# Patient Record
Sex: Female | Born: 1990 | Race: Black or African American | Hispanic: No | Marital: Single | Smoking: Never smoker
Health system: Southern US, Community
[De-identification: ages and names within clinical notes are randomized; demographics above are authoritative.]

---

## 2016-01-25 ENCOUNTER — Emergency Department (HOSPITAL_COMMUNITY)
Admission: EM | Admit: 2016-01-25 | Discharge: 2016-01-25 | Disposition: A | Discharge status: Home / Self Care | Payer: BLUE CROSS/BLUE SHIELD | Attending: Emergency Medicine | Admitting: Emergency Medicine

## 2016-01-25 ENCOUNTER — Emergency Department (HOSPITAL_COMMUNITY): Payer: BLUE CROSS/BLUE SHIELD

## 2016-01-25 ENCOUNTER — Encounter (HOSPITAL_COMMUNITY): Payer: Self-pay

## 2016-01-25 DIAGNOSIS — Y929 Unspecified place or not applicable: Secondary | ICD-10-CM | POA: Diagnosis not present

## 2016-01-25 DIAGNOSIS — S0993XA Unspecified injury of face, initial encounter: Secondary | ICD-10-CM | POA: Diagnosis present

## 2016-01-25 DIAGNOSIS — S022XXA Fracture of nasal bones, initial encounter for closed fracture: Secondary | ICD-10-CM | POA: Diagnosis not present

## 2016-01-25 DIAGNOSIS — S63501A Unspecified sprain of right wrist, initial encounter: Secondary | ICD-10-CM | POA: Diagnosis not present

## 2016-01-25 DIAGNOSIS — Y999 Unspecified external cause status: Secondary | ICD-10-CM | POA: Diagnosis not present

## 2016-01-25 DIAGNOSIS — Y939 Activity, unspecified: Secondary | ICD-10-CM | POA: Diagnosis not present

## 2016-01-25 MED ORDER — TRAMADOL HCL 50 MG PO TABS
50.0000 mg | ORAL_TABLET | Freq: Once | ORAL | Status: AC
  Administered 2016-01-25: 50 mg via ORAL
  Filled 2016-01-25: qty 1

## 2016-01-25 MED ORDER — TRAMADOL HCL 50 MG PO TABS: 50.0000 mg | ORAL_TABLET | Freq: Four times a day (QID) | ORAL | 0 refills | Status: DC | PRN

## 2016-01-25 NOTE — Discharge Instructions (Signed)
Follow up with ENT. Take pain medication as needed. Return to the ED if you experience loss of consciousness, uncontrollable nose bleeding, blurred vision, vomiting.

## 2016-01-25 NOTE — ED Notes (Signed)
Patient transported to CT 
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
## 2019-10-11 ENCOUNTER — Ambulatory Visit
Admission: EM | Admit: 2019-10-11 | Discharge: 2019-10-11 | Disposition: A | Discharge status: Home / Self Care | Payer: Commercial Managed Care - PPO | Attending: Family Medicine | Admitting: Family Medicine

## 2019-10-11 ENCOUNTER — Other Ambulatory Visit: Payer: Self-pay

## 2019-10-11 ENCOUNTER — Ambulatory Visit: Payer: Self-pay

## 2019-10-11 ENCOUNTER — Encounter: Payer: Self-pay | Admitting: Emergency Medicine

## 2019-10-11 DIAGNOSIS — R5382 Chronic fatigue, unspecified: Secondary | ICD-10-CM | POA: Diagnosis present

## 2019-10-11 DIAGNOSIS — J029 Acute pharyngitis, unspecified: Secondary | ICD-10-CM | POA: Diagnosis not present

## 2019-10-11 DIAGNOSIS — R635 Abnormal weight gain: Secondary | ICD-10-CM

## 2019-10-11 LAB — BASIC METABOLIC PANEL
Anion gap: 6 (ref 5–15)
BUN: 9 mg/dL (ref 6–20)
CO2: 26 mmol/L (ref 22–32)
Calcium: 8.9 mg/dL (ref 8.9–10.3)
Chloride: 105 mmol/L (ref 98–111)
Creatinine, Ser: 0.81 mg/dL (ref 0.44–1.00)
GFR calc Af Amer: >60 mL/min (ref >60)
GFR calc non Af Amer: >60 mL/min (ref >60)
Glucose, Bld: 67 mg/dL — ABNORMAL LOW (ref 70–99)
Potassium: 3.3 mmol/L — ABNORMAL LOW (ref 3.5–5.1)
Sodium: 137 mmol/L (ref 135–145)

## 2019-10-11 LAB — CBC WITH DIFFERENTIAL/PLATELET
Abs Immature Granulocytes: 0.01 10*3/uL (ref 0.00–0.07)
Basophils Absolute: 0 10*3/uL (ref 0.0–0.1)
Basophils Relative: 1 %
Eosinophils Absolute: 0.1 10*3/uL (ref 0.0–0.5)
Eosinophils Relative: 1 %
HCT: 40.7 % (ref 36.0–46.0)
Hemoglobin: 13.9 g/dL (ref 12.0–15.0)
Immature Granulocytes: 0 %
Lymphocytes Relative: 42 %
Lymphs Abs: 2.7 10*3/uL (ref 0.7–4.0)
MCH: 27.6 pg (ref 26.0–34.0)
MCHC: 34.2 g/dL (ref 30.0–36.0)
MCV: 80.9 fL (ref 80.0–100.0)
Monocytes Absolute: 0.6 10*3/uL (ref 0.1–1.0)
Monocytes Relative: 10 %
Neutro Abs: 3 10*3/uL (ref 1.7–7.7)
Neutrophils Relative %: 46 %
Platelets: 328 10*3/uL (ref 150–400)
RBC: 5.03 MIL/uL (ref 3.87–5.11)
RDW: 12.9 % (ref 11.5–15.5)
WBC: 6.5 10*3/uL (ref 4.0–10.5)
nRBC: 0 % (ref 0.0–0.2)

## 2019-10-11 LAB — GROUP A STREP BY PCR: Group A Strep by PCR: NOT DETECTED

## 2019-10-11 LAB — TSH: TSH: 2.845 u[IU]/mL (ref 0.350–4.500)

## 2019-10-11 LAB — PREGNANCY, URINE: Preg Test, Ur: NEGATIVE

## 2019-10-11 NOTE — Discharge Instructions (Signed)
Awaiting lab results.  Please see your OB GYN.  Results will go to your MyChart.  Take care  Dr. Adriana Simas

## 2019-10-11 NOTE — ED Triage Notes (Signed)
Patient in today c/o pain in her throat x 4 days. Patient also c/o extreme fatigue x 3 months and a 40lb weight gain in 6 months. Patient denies fever. Patient has taken OTC Ibuprofen.

## 2019-10-11 NOTE — ED Triage Notes (Signed)
Patient doesn't have a PCP, but saw her GYN in 06/2019. Patient did not mention the fatigue and weight gain to her GYN.

## 2019-10-12 NOTE — ED Provider Notes (Signed)
[facility]    CSN: [id number] Arrival date & time: 10/11/19  1208  History   Chief Complaint Chief Complaint  Patient presents with  . throat pain  . Fatigue  . Weight Gain   HPI  29 year old female presents with the above complaints.  Patient reports sore throat for the past 4 days.  No respiratory symptoms.  Patient also reports she has had ongoing weight gain since December.  States that she has gained 40 pounds.  Patient also reports ongoing severe fatigue.  This has been going on for least 3 months.  Patient does not have a primary care provider.  She does have an OB/GYN and has not seen anyone for her complaints.  Patient states that she exercises approximately 1 time a week.  She endorses a healthy diet.  No other reported symptoms.  No other complaints.  Past Surgical History:  Procedure Laterality Date  . CESAREAN SECTION      OB History   No obstetric history on file.      Home Medications    Prior to Admission medications   Not on File    Family History Family History  Problem Relation Age of Onset  . Hypertension Mother   . Hypertension Father     Social History Social History   Tobacco Use  . Smoking status: Never Smoker  . Smokeless tobacco: Never Used  Vaping Use  . Vaping Use: Never used  Substance Use Topics  . Alcohol use: Yes    Comment: rare  . Drug use: Never     Allergies   Patient has no known allergies.   Review of Systems Review of Systems  Constitutional: Positive for fatigue and unexpected weight change.  HENT: Positive for sore throat.    Physical Exam Triage Vital Signs ED Triage Vitals  Enc Vitals Group     BP 10/11/19 1252 113/77     Pulse Rate 10/11/19 1252 89     Resp 10/11/19 1252 18     Temp 10/11/19 1252 97.6 F (36.4 C)     Temp Source 10/11/19 1252 Oral     SpO2 10/11/19 1252 100 %     Weight 10/11/19 1253 230 lb (104.3 kg)     Height 10/11/19 1253 5' 3.5" (1.613 m)     Head  Circumference --      Peak Flow --      Pain Score 10/11/19 1253 6     Pain Loc --      Pain Edu? --      Excl. in GC? --    Updated Vital Signs BP 113/77 (BP Location: Left Arm)   Pulse 89   Temp 97.6 F (36.4 C) (Oral)   Resp 18   Ht 5' 3.5" (1.613 m)   Wt 104.3 kg   LMP 08/11/2019 (Approximate)   SpO2 100%   BMI 40.10 kg/m   Visual Acuity Right Eye Distance:   Left Eye Distance:   Bilateral Distance:    Right Eye Near:   Left Eye Near:    Bilateral Near:     Physical Exam Vitals and nursing note reviewed.  Constitutional:      General: She is not in acute distress.    Appearance: Normal appearance. She is obese. She is not ill-appearing.  HENT:     Head: Normocephalic and atraumatic.     Mouth/Throat:     Pharynx: Posterior oropharyngeal erythema present. No oropharyngeal exudate.  Eyes:  General:        Right eye: No discharge.        Left eye: No discharge.     Conjunctiva/sclera: Conjunctivae normal.  Cardiovascular:     Rate and Rhythm: Normal rate and regular rhythm.  Pulmonary:     Effort: Pulmonary effort is normal.     Breath sounds: Normal breath sounds. No wheezing or rales.  Neurological:     Mental Status: She is alert.  Psychiatric:        Mood and Affect: Mood normal.        Behavior: Behavior normal.    UC Treatments / Results  Labs (all labs ordered are listed, but only abnormal results are displayed) Labs Reviewed  BASIC METABOLIC PANEL - Abnormal; Notable for the following components:      Result Value   Potassium 3.3 (*)    Glucose, Bld 67 (*)    All other components within normal limits  GROUP A STREP BY PCR  PREGNANCY, URINE  CBC WITH DIFFERENTIAL/PLATELET  TSH    EKG   Radiology No results found.  Procedures Procedures (including critical care time)  Medications Ordered in UC Medications - No data to display  Initial Impression / Assessment and Plan / UC Course  I have reviewed the triage vital signs and  the nursing notes.  Pertinent labs & imaging results that were available during my care of the patient were reviewed by me and considered in my medical decision making (see chart for details)    29 year old female presents with 3 separate complaints. #1.  Sore throat: Strep PCR negative.  Supportive care. #2.  Weight gain: I informed the patient that there are many causes for weight gain.  Basic laboratory studies were done today for initial work-up.  CBC normal.  Metabolic panel with slightly low glucose and mild hypokalemia.  TSH normal.  Advised patient that she needs follow-up with primary care physician and/or OB/GYN for further evaluation. #3.  Chronic fatigue: Patient advised that there are numerous causes for chronic fatigue.  Basic labs done today.  Essentially unremarkable.  Needs follow-up with primary care physician and/or OB/GYN.  Final Clinical Impressions(s) / UC Diagnoses   Final diagnoses:  Acute pharyngitis, unspecified etiology  Weight gain  Chronic fatigue     Discharge Instructions     Awaiting lab results.  Please see your OB GYN.  Results will go to your MyChart.  Take care  Dr. Lacinda Axon    ED Prescriptions    None     PDMP not reviewed this encounter.   Coral Spikes, [place] 10/12/19 1314
